# Patient Record
Sex: Male | Born: 2000 | Race: Black or African American | Hispanic: No | Marital: Single | Smoking: Never smoker
Health system: Southern US, Community
[De-identification: ages and names within clinical notes are randomized; demographics above are authoritative.]

## PROBLEM LIST (undated history)

## (undated) DIAGNOSIS — F909 Attention-deficit hyperactivity disorder, unspecified type: Secondary | ICD-10-CM

## (undated) HISTORY — PX: INNER EAR SURGERY: SHX679

---
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
## 2019-04-18 ENCOUNTER — Ambulatory Visit: Payer: Self-pay | Attending: Internal Medicine

## 2020-08-30 ENCOUNTER — Emergency Department: Payer: Medicaid Other

## 2020-08-30 ENCOUNTER — Emergency Department
Admission: EM | Admit: 2020-08-30 | Discharge: 2020-08-30 | Disposition: A | Discharge status: Home / Self Care | Payer: Medicaid Other | Attending: Emergency Medicine | Admitting: Emergency Medicine

## 2020-08-30 DIAGNOSIS — Y990 Civilian activity done for income or pay: Secondary | ICD-10-CM | POA: Insufficient documentation

## 2020-08-30 DIAGNOSIS — X16XXXA Contact with hot heating appliances, radiators and pipes, initial encounter: Secondary | ICD-10-CM | POA: Diagnosis not present

## 2020-08-30 DIAGNOSIS — S0990XA Unspecified injury of head, initial encounter: Secondary | ICD-10-CM | POA: Insufficient documentation

## 2020-08-30 DIAGNOSIS — Y9389 Activity, other specified: Secondary | ICD-10-CM | POA: Insufficient documentation

## 2020-08-30 DIAGNOSIS — Y9289 Other specified places as the place of occurrence of the external cause: Secondary | ICD-10-CM | POA: Diagnosis not present

## 2020-08-30 HISTORY — DX: Attention-deficit hyperactivity disorder, unspecified type: F90.9

## 2020-08-30 NOTE — ED Provider Notes (Signed)
[facility] Emergency Department Provider Note  Time seen: 5:31 AM  I have reviewed the triage vital signs and the nursing notes.   HISTORY  Chief Complaint Head Injury   HPI Zohaib D Holford is a 20 y.o. male with a past medical history of ADHD presents to the emergency department for head injury.  According to the patient at noon yesterday states he was working on a car when an exhaust pipe fell onto his head.  Patient states he felt like he was going to pass out but he did not.  Denies any loss of conscious.  Denies any vomiting.  Denies any weakness or numbness of any arm or leg.   Past Medical History:  Diagnosis Date  . ADHD     There are no problems to display for this patient.   Past Surgical History:  Procedure Laterality Date  . INNER EAR SURGERY      Prior to Admission medications   Not on File    No Known Allergies  No family history on file.  Social History Social History   Tobacco Use  . Smoking status: Never Smoker  Substance Use Topics  . Alcohol use: Never  . Drug use: Never    Review of Systems Constitutional: Negative for fever. Cardiovascular: Negative for chest pain. Respiratory: Negative for shortness of breath. Gastrointestinal: Negative for abdominal pain Musculoskeletal: Negative for musculoskeletal complaints Neurological: Moderate headache All other ROS negative  ____________________________________________   PHYSICAL EXAM:  VITAL SIGNS: ED Triage Vitals  Enc Vitals Group     BP 08/30/20 0158 (!) 144/95     Pulse Rate 08/30/20 0158 87     Resp 08/30/20 0158 18     Temp 08/30/20 0158 99 F (37.2 C)     Temp Source 08/30/20 0158 Oral     SpO2 08/30/20 0158 98 %     Weight --      Height 08/30/20 0158 5\' 9"  (1.753 m)     Head Circumference --      Peak Flow --      Pain Score 08/30/20 0157 6     Pain Loc --      Pain Edu? --      Excl. in GC? --     Constitutional: Alert and oriented. Well  appearing and in no distress. Eyes: Normal exam ENT      Head: Normocephalic and atraumatic.      Mouth/Throat: Mucous membranes are moist. Cardiovascular: Normal rate, regular rhythm Respiratory: Normal respiratory effort without tachypnea nor retractions. Breath sounds are clear Gastrointestinal: Soft and nontender. No distention. Musculoskeletal: Nontender with normal range of motion in all extremities.  Neurologic:  Normal speech and language. No gross focal neurologic deficits are appreciated. Skin:  Skin is warm, dry and intact.  Psychiatric: Mood and affect are normal. Speech and behavior are normal.   ____________________________________________   RADIOLOGY  CT scan head and C-spine are negative for acute abnormality  ____________________________________________   INITIAL IMPRESSION / ASSESSMENT AND PLAN / ED COURSE  Pertinent labs & imaging results that were available during my care of the patient were reviewed by me and considered in my medical decision making (see chart for details).   Patient presents to the emergency department after head injury around noon.  Patient appears well, no distress.  Reassuring physical exam.  CT scans are negative for acute abnormality.  Discussed with patient headache care at home and possible concussion care including avoiding physical or  mental stimulation, Tylenol or ibuprofen and discussed return precautions.  Patient agreeable plan of care.  Lenoard D Marez was evaluated in Emergency Department on 08/30/2020 for the symptoms described in the history of present illness. He was evaluated in the context of the global COVID-19 pandemic, which necessitated consideration that the patient might be at risk for infection with the SARS-CoV-2 virus that causes COVID-19. Institutional protocols and algorithms that pertain to the evaluation of patients at risk for COVID-19 are in a state of rapid change based on information released by regulatory bodies  including the CDC and federal and state organizations. These policies and algorithms were followed during the patient's care in the ED.  ____________________________________________   FINAL CLINICAL IMPRESSION(S) / ED DIAGNOSES  Head injury   Minna Antis, MD 08/30/20 [phone]
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
## 2020-09-19 ENCOUNTER — Other Ambulatory Visit: Payer: Self-pay

## 2020-09-19 ENCOUNTER — Emergency Department: Payer: Medicaid Other

## 2020-09-19 DIAGNOSIS — S40012A Contusion of left shoulder, initial encounter: Secondary | ICD-10-CM | POA: Diagnosis not present

## 2020-09-19 DIAGNOSIS — M791 Myalgia, unspecified site: Secondary | ICD-10-CM | POA: Insufficient documentation

## 2020-09-19 DIAGNOSIS — S4992XA Unspecified injury of left shoulder and upper arm, initial encounter: Secondary | ICD-10-CM | POA: Diagnosis present

## 2020-09-19 DIAGNOSIS — Y92410 Unspecified street and highway as the place of occurrence of the external cause: Secondary | ICD-10-CM | POA: Insufficient documentation
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
## 2020-09-20 ENCOUNTER — Emergency Department
Admission: EM | Admit: 2020-09-20 | Discharge: 2020-09-20 | Disposition: A | Discharge status: Home / Self Care | Payer: Medicaid Other | Attending: Emergency Medicine | Admitting: Emergency Medicine

## 2020-09-20 DIAGNOSIS — S40012A Contusion of left shoulder, initial encounter: Secondary | ICD-10-CM

## 2020-09-20 DIAGNOSIS — M7918 Myalgia, other site: Secondary | ICD-10-CM

## 2020-09-20 MED ORDER — NAPROXEN 500 MG PO TABS
500.0000 mg | ORAL_TABLET | Freq: Two times a day (BID) | ORAL | 0 refills | Status: DC
Start: 2020-09-20 — End: 2021-07-24

## 2020-09-20 MED ORDER — HYDROCODONE-ACETAMINOPHEN 5-325 MG PO TABS: 1.0000 | ORAL_TABLET | Freq: Four times a day (QID) | ORAL | 0 refills | Status: DC | PRN

## 2020-09-20 MED ORDER — ACETAMINOPHEN 500 MG PO TABS
1000.0000 mg | ORAL_TABLET | Freq: Once | ORAL | Status: AC
  Administered 2020-09-20: 1000 mg via ORAL
  Filled 2020-09-20: qty 2

## 2020-09-20 NOTE — Discharge Instructions (Addendum)
Follow-up with your primary care provider or urgent care if any continued problems or concerns.  A prescription for naproxen and hydrocodone was sent to your pharmacy.  The naproxen is for inflammation which will also help with pain.  The hydrocodone is for pain and should not be taken while driving or operating machinery.  Ice and elevation as needed to joints you may also use warm moist compresses to your muscles as needed for discomfort.  You will be sore for approximately 4 to 5 days even with medication.  Try to move frequently to decrease the amount of stiffness.  Tomorrow you will have more stiffness than you currently have now.
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
## 2021-03-27 ENCOUNTER — Other Ambulatory Visit: Payer: Self-pay

## 2021-03-27 ENCOUNTER — Encounter: Payer: Self-pay | Admitting: Emergency Medicine

## 2021-03-27 ENCOUNTER — Emergency Department
Admission: EM | Admit: 2021-03-27 | Discharge: 2021-03-27 | Disposition: A | Discharge status: Home / Self Care | Payer: Medicaid Other | Attending: Emergency Medicine | Admitting: Emergency Medicine

## 2021-03-27 DIAGNOSIS — Z5321 Procedure and treatment not carried out due to patient leaving prior to being seen by health care provider: Secondary | ICD-10-CM | POA: Insufficient documentation

## 2021-03-27 DIAGNOSIS — S61552A Open bite of left wrist, initial encounter: Secondary | ICD-10-CM | POA: Insufficient documentation

## 2021-03-27 DIAGNOSIS — W540XXA Bitten by dog, initial encounter: Secondary | ICD-10-CM | POA: Insufficient documentation

## 2021-03-27 NOTE — ED Notes (Signed)
Pt has not returned

## 2021-03-27 NOTE — ED Triage Notes (Signed)
Patient ambulatory to triage with steady gait, without difficulty or distress noted; pt reports bit by his dog at home PTA; st "he doesn't like to go in his cage, he is just a little dog"; superficial scratch noted to left wrist

## 2021-03-27 NOTE — ED Notes (Signed)
Pt left lobby

## 2021-05-06 ENCOUNTER — Emergency Department: Payer: Medicaid Other

## 2021-05-06 ENCOUNTER — Encounter: Payer: Self-pay | Admitting: Emergency Medicine

## 2021-05-06 ENCOUNTER — Emergency Department
Admission: EM | Admit: 2021-05-06 | Discharge: 2021-05-06 | Disposition: A | Discharge status: Home / Self Care | Payer: Medicaid Other | Attending: Emergency Medicine | Admitting: Emergency Medicine

## 2021-05-06 ENCOUNTER — Other Ambulatory Visit: Payer: Self-pay

## 2021-05-06 DIAGNOSIS — S91332A Puncture wound without foreign body, left foot, initial encounter: Secondary | ICD-10-CM | POA: Insufficient documentation

## 2021-05-06 DIAGNOSIS — W25XXXA Contact with sharp glass, initial encounter: Secondary | ICD-10-CM | POA: Diagnosis not present

## 2021-05-06 DIAGNOSIS — Y92009 Unspecified place in unspecified non-institutional (private) residence as the place of occurrence of the external cause: Secondary | ICD-10-CM | POA: Insufficient documentation

## 2021-05-06 DIAGNOSIS — S99922A Unspecified injury of left foot, initial encounter: Secondary | ICD-10-CM | POA: Diagnosis present

## 2021-05-06 MED ORDER — CEPHALEXIN 500 MG PO CAPS: 500.0000 mg | ORAL_CAPSULE | Freq: Four times a day (QID) | ORAL | 0 refills | Status: DC

## 2021-05-06 MED ORDER — CEPHALEXIN 500 MG PO CAPS
500.0000 mg | ORAL_CAPSULE | Freq: Once | ORAL | Status: AC
  Administered 2021-05-06: 05:00:00 500 mg via ORAL
  Filled 2021-05-06: qty 1

## 2021-05-06 MED ORDER — IBUPROFEN 600 MG PO TABS
600.0000 mg | ORAL_TABLET | ORAL | Status: AC
  Administered 2021-05-06: 05:00:00 600 mg via ORAL
  Filled 2021-05-06: qty 1

## 2021-05-06 NOTE — ED Notes (Signed)
Left foot placed in a basin and soaked with 1/2 betadine and 1/2 saline for approx 5-10 minutes. Then,bottom of left foot scrubbed, cleansed, and dried.

## 2021-05-06 NOTE — ED Triage Notes (Signed)
Pt to ED from home c/o left foot lac.  States broke a glass in kitchen earlier today and stepped on glass this evening.  States pulled out approx 1 inch long piece from foot, was bleeding at the time but has since stopped the bleeding per patient.  Pt ambulatory, skin color WNL, in NAD at this time.

## 2021-05-06 NOTE — ED Provider Notes (Signed)
[facility] Provider Note    Event Date/Time   First MD Initiated Contact with Patient 05/06/21 [phone]     (approximate)   History   Extremity Laceration   HPI  Craig Turner is a 21 y.o. male   reports no significant past medical history takes no prescriptions  Patient earlier today broke a piece of glass on the floor in his home.  This evening sometime around midnight when he was walking about he excellently stepped on a small shard of it with his left foot.  He had a shard of glass that went into the back pointing around the sole of his foot and he had to pull that out.  He also reports he feels like he had a tiny cut right at the bottom of his great toe on the left foot as well.  The injury on the sole of the foot bled a fair amount, and he was able to remove what was 1 large shard of glass.  Since then the area has been sore but bled quite a bit.  The bleeding has since stopped.  The area feels just slightly sore to touch  He is up-to-date on his immunizations including tetanus shot.  Denies any other injury.  No numbness or weakness in the foot.      Physical Exam   Triage Vital Signs: ED Triage Vitals [05/06/21 0215]  Enc Vitals Group     BP 139/86     Pulse Rate (!) 106     Resp 18     Temp 99 F (37.2 C)     Temp Source Oral     SpO2 98 %     Weight (!) 370 lb (167.8 kg)     Height 5\' 8"  (1.727 m)     Head Circumference      Peak Flow      Pain Score 6     Pain Loc      Pain Edu?      Excl. in [place]?     Most recent vital signs: Vitals:   05/06/21 0215  BP: 139/86  Pulse: (!) 106  Resp: 18  Temp: 99 F (37.2 C)  SpO2: 98%     General: Awake, no distress.  CV:  Good peripheral perfusion.  Strong dorsalis pedis posterior tibial pulse and normal capillary refill involving the left foot Resp:  Normal effort.  Abd:  No distention.  Other:  A left lower extremity is atraumatic he uses it with good use and function including  plantar and dorsiflexion toe wiggle of all digits of the left foot.  He does have an area please see clinical image and have uploaded, approximately a little less than 1 cm in size that appears to represent any punctate puncture wound that does not appear to be very deep.  It actually seems to be already starting to close by secondary intention and is not actively bleeding.  There is no noted foreign body within it to careful inspection and examination.  In addition he has a very tiny abrasion noted at the base of the left great toe, but no noted puncture wounds or evidence of foreign body.  He does have a couple small shards that are minuscule in size that were between the first and second toes but no other injury glass or foreign body is noted.     ED Results / Procedures / Treatments   Labs (all labs ordered are listed, but only  abnormal results are displayed) Labs Reviewed - No data to display   EKG     RADIOLOGY  I personally viewed the patient's left foot x-ray which is negative for fracture and I do not see large gross foreign body retained in the soft tissues  I also reviewed the radiologist report  DG Foot Complete Left  Result Date: 05/06/2021 CLINICAL DATA:  Concern for foreign body. EXAM: LEFT FOOT - COMPLETE 3+ VIEW COMPARISON:  None. FINDINGS: There is no acute fracture or dislocation. The bones are well mineralized. No significant arthritic changes. There is pes planus. There is diffuse soft tissue swelling of the forefoot. Punctate radiopaque focus in the soft tissues of the great toe, likely over the skin or a focus of calcification. Additional punctate radiopaque focus noted over the skin in the webspace between the fourth and fifth digits. No other radiopaque foreign object identified. IMPRESSION: 1. No acute osseous pathology. 2. Punctate radiopaque foci in the soft tissues of the great toe and in the webspace between the fourth and fifth digits may be over the skin. 3.  Pes planus. Electronically Signed   By: Anner Crete M.D.   On: 05/06/2021 02:59           PROCEDURES:  Critical Care performed: No  Procedures   MEDICATIONS ORDERED IN ED: Medications  cephALEXin (KEFLEX) capsule 500 mg (500 mg Oral Given 05/06/21 0438)  ibuprofen (ADVIL) tablet 600 mg (600 mg Oral Given 05/06/21 0438)     IMPRESSION / MDM / ASSESSMENT AND PLAN / ED COURSE  I reviewed the triage vital signs and the nursing notes.                              Differential diagnosis includes, but is not limited to, laceration or puncture wound of the left foot.  Retained foreign body also considered, though I do not see clear evidence of this on examination.  Patient no longer having any evidence of any bleeding.  He does have evidence of a small puncture wound at the heel of the left foot, and is not bleeding there is no evidence of foreign body on close inspection and exploration.  His pain is well controlled.  Discussed with the patient, and also patient received iodine soak cleansing and cleaning.  We will start him on cephalexin prophylactically.  We will leave the small puncture wound open, it is no longer bleeding appears to be already healing over by secondary intention.  Discussed careful return precautions, keeping the wound bandaged and clean, and recommendation of follow-up with podiatry.         Patient's foot was cleansed and scrubbed, placed in iodine bath.  I examined the patient's foot closely and I do not see evidence of a retained foreign body at this time.  We will place the patient on prophylactic cephalexin to prevent infection.  He is up-to-date on his tetanus shot.  FINAL CLINICAL IMPRESSION(S) / ED DIAGNOSES   Final diagnoses:  Puncture wound of left foot, initial encounter     Rx / DC Orders   ED Discharge Orders          Ordered    cephALEXin (KEFLEX) 500 MG capsule  4 times daily        05/06/21 0458             Note:  This  document was prepared using Dragon voice recognition software and may include  unintentional dictation errors.   Delman Kitten, MD 05/06/21 [phone]

## 2021-05-06 NOTE — ED Notes (Signed)
Non-adherent pad placed over wound on left foot and secured with coban.

## 2021-07-24 ENCOUNTER — Encounter: Payer: Self-pay | Admitting: Emergency Medicine

## 2021-07-24 ENCOUNTER — Emergency Department: Payer: Medicaid Other

## 2021-07-24 ENCOUNTER — Emergency Department
Admission: EM | Admit: 2021-07-24 | Discharge: 2021-07-24 | Disposition: A | Discharge status: Home / Self Care | Payer: Medicaid Other | Attending: Emergency Medicine | Admitting: Emergency Medicine

## 2021-07-24 ENCOUNTER — Other Ambulatory Visit: Payer: Self-pay

## 2021-07-24 DIAGNOSIS — W19XXXA Unspecified fall, initial encounter: Secondary | ICD-10-CM

## 2021-07-24 DIAGNOSIS — S93401A Sprain of unspecified ligament of right ankle, initial encounter: Secondary | ICD-10-CM | POA: Diagnosis not present

## 2021-07-24 DIAGNOSIS — S99911A Unspecified injury of right ankle, initial encounter: Secondary | ICD-10-CM | POA: Diagnosis present

## 2021-07-24 DIAGNOSIS — S8001XA Contusion of right knee, initial encounter: Secondary | ICD-10-CM | POA: Insufficient documentation

## 2021-07-24 DIAGNOSIS — W010XXA Fall on same level from slipping, tripping and stumbling without subsequent striking against object, initial encounter: Secondary | ICD-10-CM | POA: Insufficient documentation

## 2021-07-24 MED ORDER — CYCLOBENZAPRINE HCL 5 MG PO TABS: 5.0000 mg | ORAL_TABLET | Freq: Three times a day (TID) | ORAL | 0 refills | Status: AC | PRN

## 2021-07-24 MED ORDER — IBUPROFEN 800 MG PO TABS: 800.0000 mg | ORAL_TABLET | Freq: Three times a day (TID) | ORAL | 0 refills | Status: AC | PRN

## 2021-07-24 NOTE — ED Notes (Signed)
See triage note  presents s/p fall  states he slipped 2 days ago   having pain to right knee/ankle  no deformity noted   good pulses ?

## 2021-07-24 NOTE — Discharge Instructions (Addendum)
Your exam and x-rays are normal and reassuring following your fall.  No signs of a serious bony injury.  Take the prescription muscle relaxant and ibuprofen for pain relief.  Apply ice to reduce pain and swelling.  Follow-up with orthopedics if symptoms persist. ?

## 2021-07-24 NOTE — ED Provider Notes (Signed)
? ? ?[facility] ?Emergency Department Provider Note ? ? ? ? Event Date/Time  ? First MD Initiated Contact with Patient 07/24/21 [phone]   ?  (approximate) ? ? ?History  ? ?Fall ? ? ?HPI ? ?Craig Turner is a 21 y.o. male with a history of ADHD, presents to the ED following a mechanical fall 2 days prior.  Patient reports he slipped on a wet floor, causing pain to the right ankle and the right knee.  Denies any head injury or LOC. ?  ? ? ?Physical Exam  ? ?Triage Vital Signs: ?ED Triage Vitals  ?Enc Vitals Group  ?   BP 07/24/21 0748 (!) 139/95  ?   Pulse Rate 07/24/21 0748 87  ?   Resp 07/24/21 0748 18  ?   Temp 07/24/21 0748 98 ?F (36.7 ?C)  ?   Temp Source 07/24/21 0748 Oral  ?   SpO2 07/24/21 0748 94 %  ?   Weight 07/24/21 0742 (!) 369 lb 14.9 oz (167.8 kg)  ?   Height 07/24/21 0742 5\' 8"  (1.727 m)  ?   Head Circumference --   ?   Peak Flow --   ?   Pain Score 07/24/21 0742 7  ?   Pain Loc --   ?   Pain Edu? --   ?   Excl. in [place]? --   ? ? ?Most recent vital signs: ?Vitals:  ? 07/24/21 0748  ?BP: (!) 139/95  ?Pulse: 87  ?Resp: 18  ?Temp: 98 ?F (36.7 ?C)  ?SpO2: 94%  ? ? ?General Awake, no distress.  ?CV:  Good peripheral perfusion.  ?RESP:  Normal effort.  ?ABD:  No distention.  ?MSK:  Right knee without obvious deformity, dislocation, or joint effusion.  Patient with normal active range of motion on exam.  No significant valgus or varus when stress is elicited.  No evidence of internal derangement.  Mildly tender to palpation to the lateral knee joint at the fibular head.  No calf or Achilles tenderness is elicited.  Exam is reassuring and shows no deformity or joint effusion.  Full active range of motion is noted. ? ? ?ED Results / Procedures / Treatments  ? ?Labs ?(all labs ordered are listed, but only abnormal results are displayed) ?Labs Reviewed - No data to display ? ? ?EKG ? ? ? ?RADIOLOGY ? ?I personally viewed and evaluated these images as part of my medical decision making, as  well as reviewing the written report by the radiologist. ? ?ED Provider Interpretation: no acute findings} ? ?DG Ankle Complete Right ? ?Result Date: 07/24/2021 ?CLINICAL DATA:  Fall a few days ago, right ankle and knee pain EXAM: RIGHT ANKLE - COMPLETE 3+ VIEW COMPARISON:  None. FINDINGS: There is no acute fracture or dislocation. Alignment is normal. The ankle mortise is intact. There is soft tissue swelling over the ankle. IMPRESSION: Soft tissue swelling with no acute osseous abnormality. Electronically Signed   By: Valetta Mole M.D.   On: 07/24/2021 08:07  ? ?DG Knee Complete 4 Views Right ? ?Result Date: 07/24/2021 ?CLINICAL DATA:  right ankle pain EXAM: RIGHT KNEE - COMPLETE 4+ VIEW COMPARISON:  None. FINDINGS: No evidence of fracture, dislocation, or joint effusion. No evidence of arthropathy or other focal bone abnormality. Soft tissues are unremarkable. IMPRESSION: Negative. Electronically Signed   By: Lucrezia Europe M.D.   On: 07/24/2021 08:05   ? ? ?PROCEDURES: ? ?Critical Care performed: No ? ?Procedures ? ? ?  MEDICATIONS ORDERED IN ED: ?Medications - No data to display ? ? ?IMPRESSION / MDM / ASSESSMENT AND PLAN / ED COURSE  ?I reviewed the triage vital signs and the nursing notes. ?             ?               ? ?Differential diagnosis includes, but is not limited to, knee sprain, knee fracture, dislocation, contusion, ankle sprain ? ?Patient to the ED for evaluation after mechanical fall.  Patient is no acute distress reporting pain to the right knee and right ankle, respectively.  Patient's exam is reassuring as it shows no acute internal derangement to the right knee or acute dysfunction of the ankle.  Radiologic evaluation reveals no acute bony process based on my review of images.  Patient's diagnosis is consistent with knee sprain and ankle sprain. Patient will be discharged home with prescriptions for naproxen and Flexeril. Patient is to follow up with orthopedics as needed or otherwise directed.  Patient is given ED precautions to return to the ED for any worsening or new symptoms. ? ?FINAL CLINICAL IMPRESSION(S) / ED DIAGNOSES  ? ?Final diagnoses:  ?Fall, initial encounter  ?Contusion of right knee, initial encounter  ?Sprain of right ankle, unspecified ligament, initial encounter  ? ? ? ?Rx / DC Orders  ? ?ED Discharge Orders   ? ?      Ordered  ?  cyclobenzaprine (FLEXERIL) 5 MG tablet  3 times daily PRN       ? 07/24/21 [id number]  ?  ibuprofen (ADVIL) 800 MG tablet  Every 8 hours PRN       ? 07/24/21 [id number]  ? ?  ?  ? ?  ? ? ? ?Note:  This document was prepared using Dragon voice recognition software and may include unintentional dictation errors. ? ?  ?Melvenia Needles, PA-C ?07/24/21 [id number] ? ?  ?Naaman Plummer, MD ?07/31/21 1034 ? ?

## 2021-07-24 NOTE — ED Triage Notes (Signed)
Pt comes into the ED via POV c/o fall a couple days ago that is causing right side ankle and knee pain.  Pt ambulatory and in NAD.  Pt states he slipped on the floor which is what caused the fall.  ?

## 2021-10-14 DIAGNOSIS — Z0279 Encounter for issue of other medical certificate: Secondary | ICD-10-CM

## 2022-05-01 IMAGING — CT CT HEAD W/O CM
3 series · 15 of 47 positions shown, 18 images · non-contrast
Comparison: None.

CLINICAL DATA: Facial trauma

EXAM:
CT HEAD WITHOUT CONTRAST
CT CERVICAL SPINE WITHOUT CONTRAST
TECHNIQUE: Multidetector CT imaging of the head and cervical spine was
performed following the standard protocol without intravenous
contrast. Multiplanar CT image reconstructions of the cervical spine
were also generated.

[Series 2: head wo · axial · 0.43mm/px · z∈[-92,+38]mm · 9 of 32 slices shown, 12 images]
[im 3/32  brain]
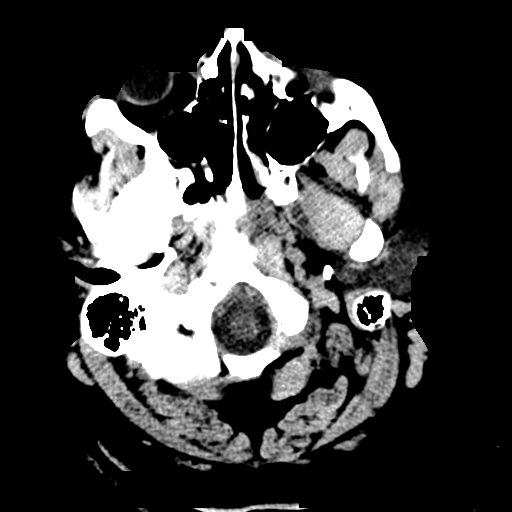
[im 3/32  bone]
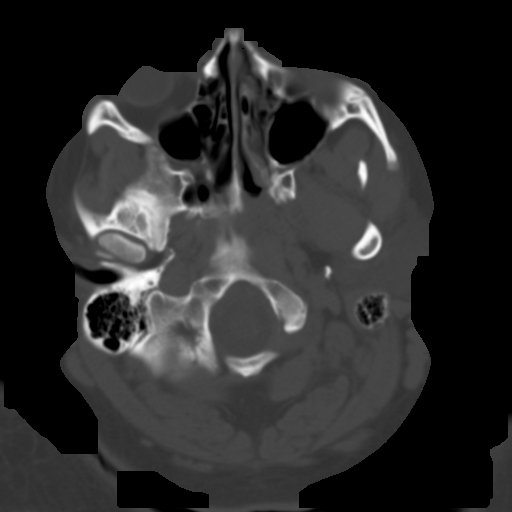
[im 6/32  brain]
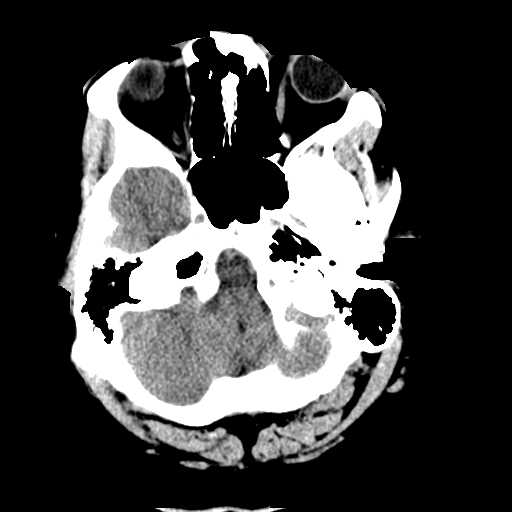
[im 9/32  brain]
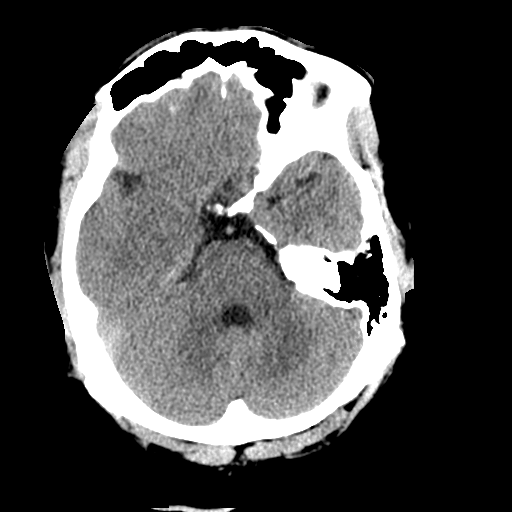
[im 12/32  brain]
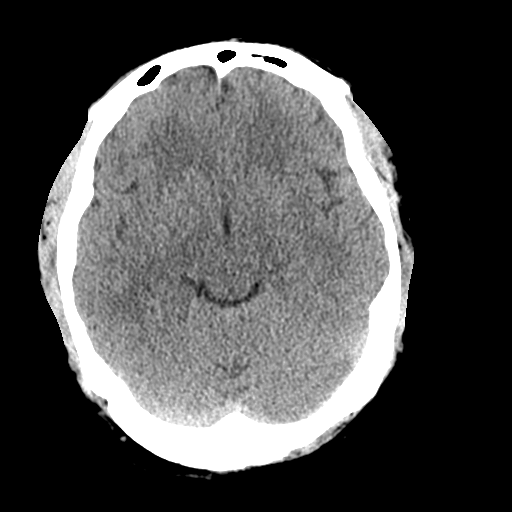
[im 17/32  brain]
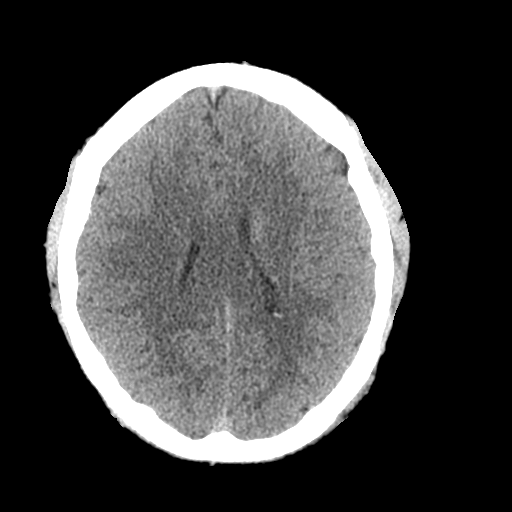
[im 17/32  bone]
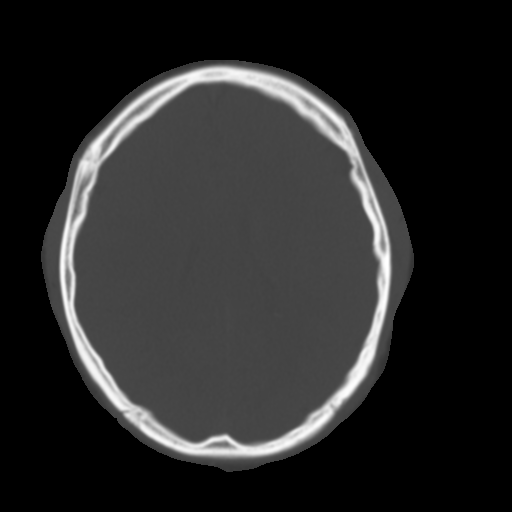
[im 20/32  brain]
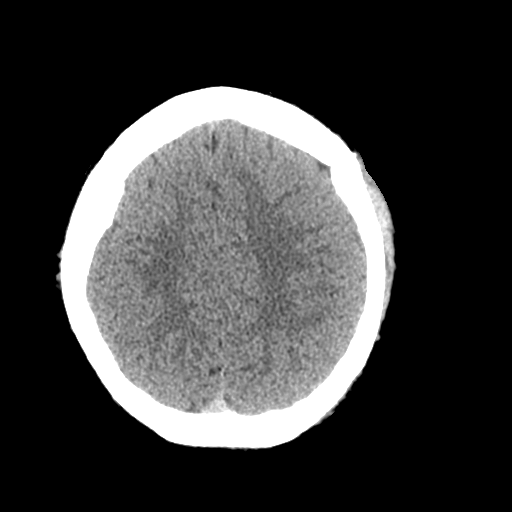
[im 23/32  brain]
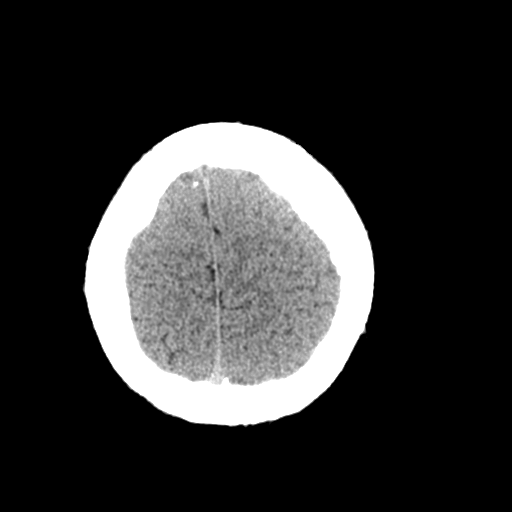
[im 26/32  brain]
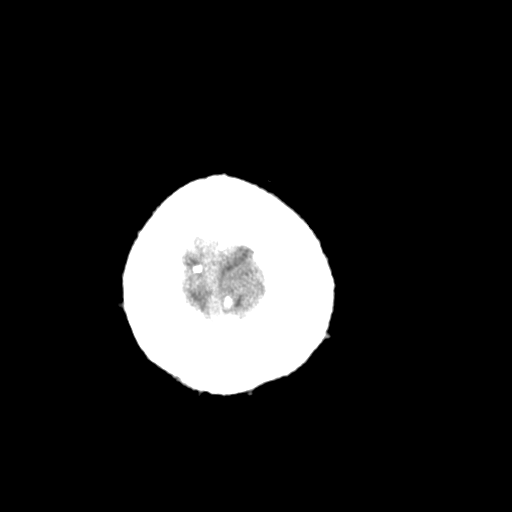
[im 29/32  brain]
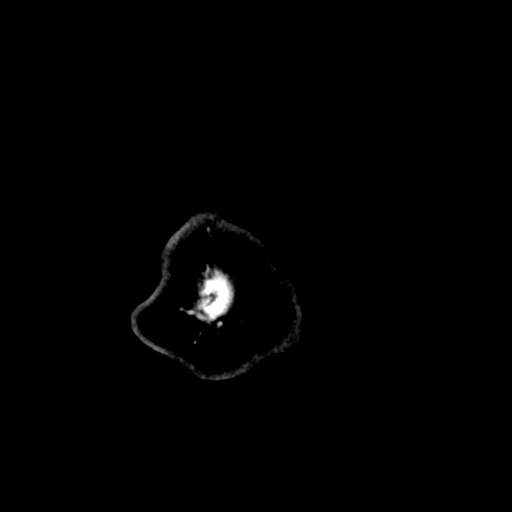
[im 29/32  bone]
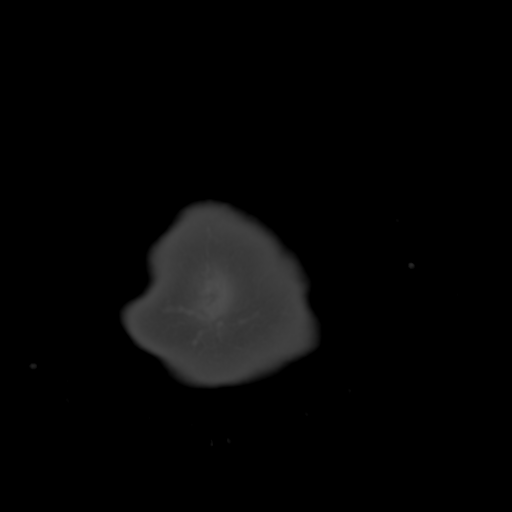

[Series 4: coronal soft tissue · coronal · 0.40mm/px · 3 of 72 slices shown]
[im 24/72  brain]
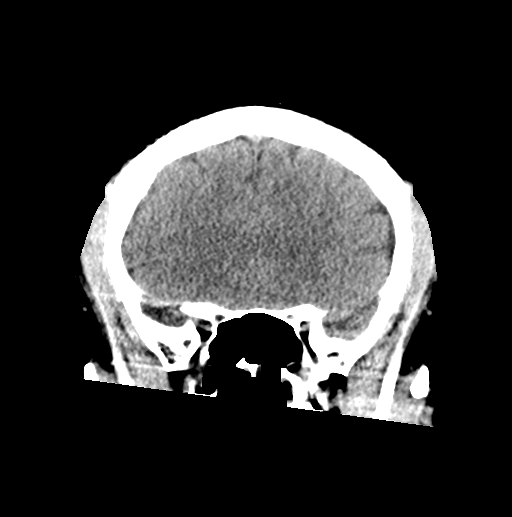
[im 32/72  brain]
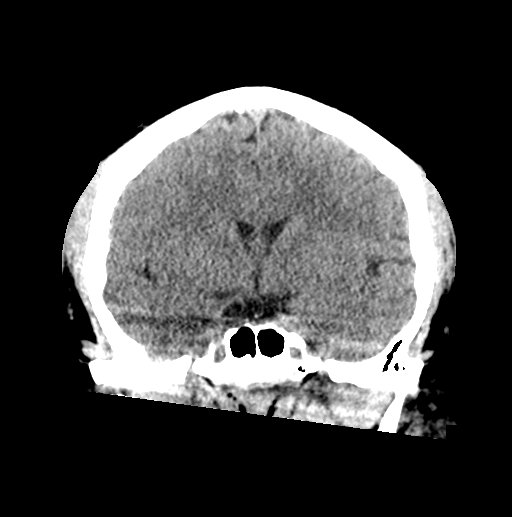
[im 40/72  brain]
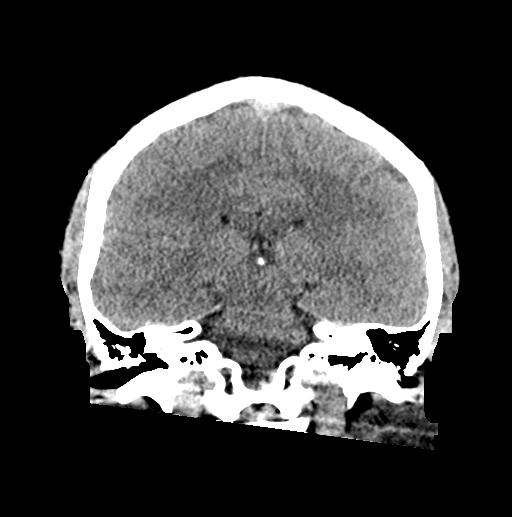

[Series 5: sagittal soft tissue · sagittal · 0.33mm/px · 3 of 64 slices shown]
[im 24/64  brain]
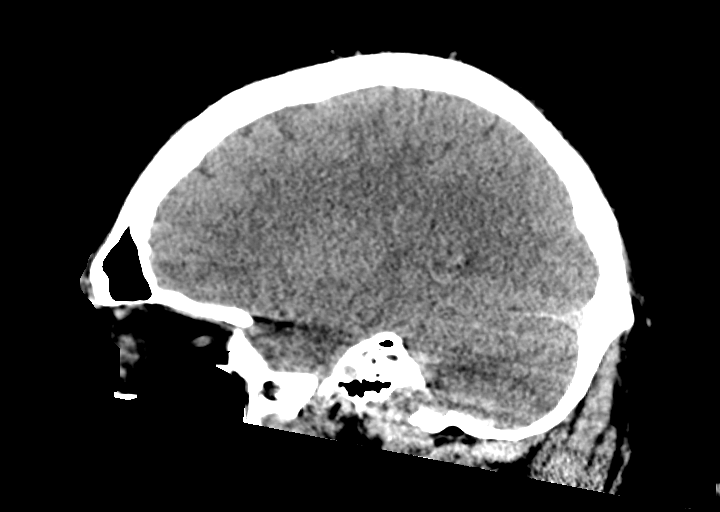
[im 32/64  brain]
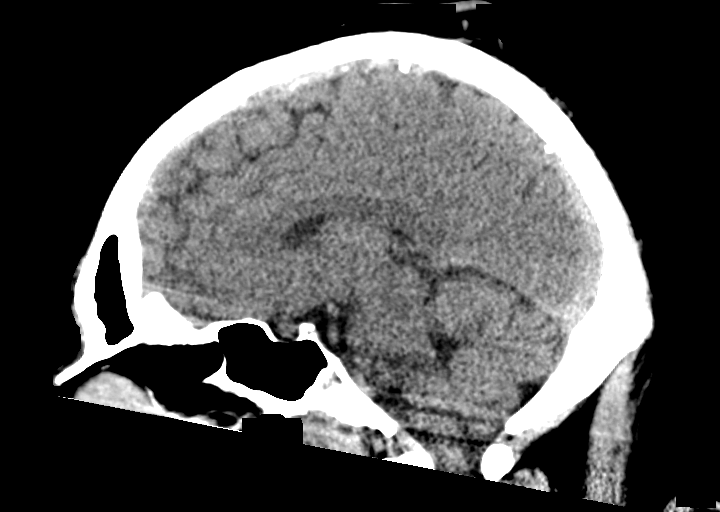
[im 40/64  brain]
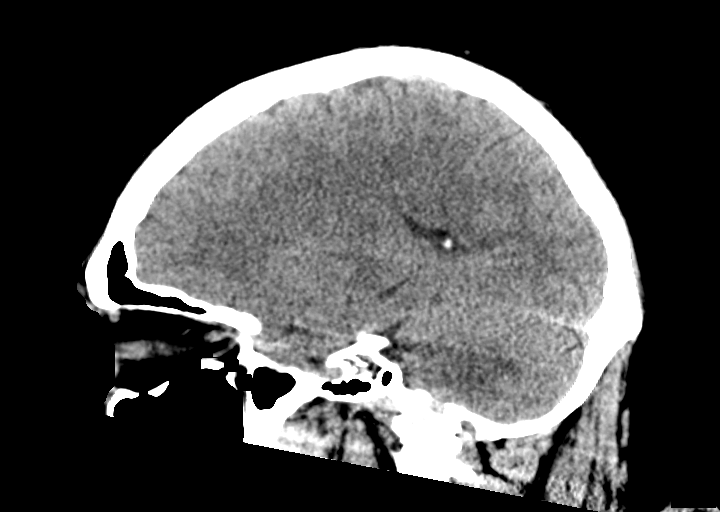

[15 of 47 positions shown; findings below may reference images not displayed]

FINDINGS: CT HEAD FINDINGS

Brain: There is no mass, hemorrhage or extra-axial collection. The
size and configuration of the ventricles and extra-axial CSF spaces
are normal. The brain parenchyma is normal, without evidence of
acute or chronic infarction.

Vascular: No abnormal hyperdensity of the major intracranial
arteries or dural venous sinuses. No intracranial atherosclerosis.

Skull: The visualized skull base, calvarium and extracranial soft
tissues are normal.

Sinuses/Orbits: No fluid levels or advanced mucosal thickening of
the visualized paranasal sinuses. No mastoid or middle ear effusion.
The orbits are normal.

CT CERVICAL SPINE FINDINGS

Alignment: No static subluxation. Facets are aligned. Occipital
condyles are normally positioned.

Skull base and vertebrae: No acute fracture.

Soft tissues and spinal canal: No prevertebral fluid or swelling. No
visible canal hematoma.

Disc levels: No advanced spinal canal or neural foraminal stenosis.

Upper chest: No pneumothorax, pulmonary nodule or pleural effusion.

Other: Normal visualized paraspinal cervical soft tissues.
IMPRESSION: 1. No acute intracranial abnormality.
2. No acute fracture or static subluxation of the cervical spine.

## 2022-05-01 IMAGING — CT CT CERVICAL SPINE W/O CM
3 of 4 series · 10 of 33 positions shown, 12 images · non-contrast
Comparison: None.

CLINICAL DATA: Facial trauma

EXAM:
CT HEAD WITHOUT CONTRAST
CT CERVICAL SPINE WITHOUT CONTRAST
TECHNIQUE: Multidetector CT imaging of the head and cervical spine was
performed following the standard protocol without intravenous
contrast. Multiplanar CT image reconstructions of the cervical spine
were also generated.

[Series 4: sagittal bone · sagittal · 0.38mm/px · 5 of 111 slices shown, 6 images]
[im 37/111  bone]
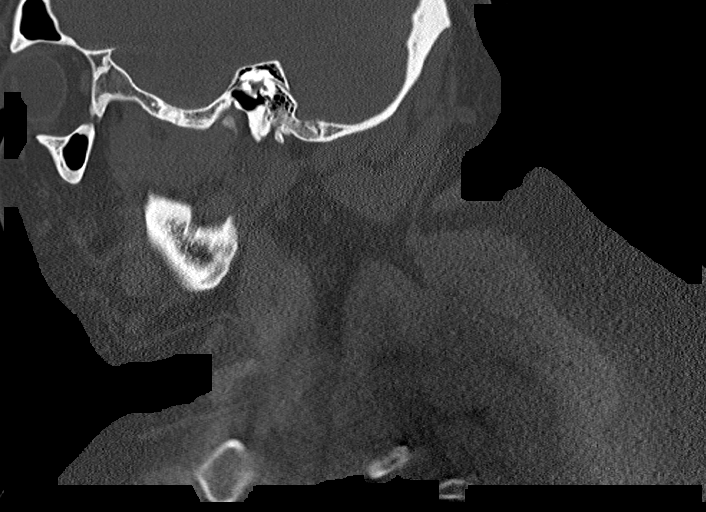
[im 46/111  bone]
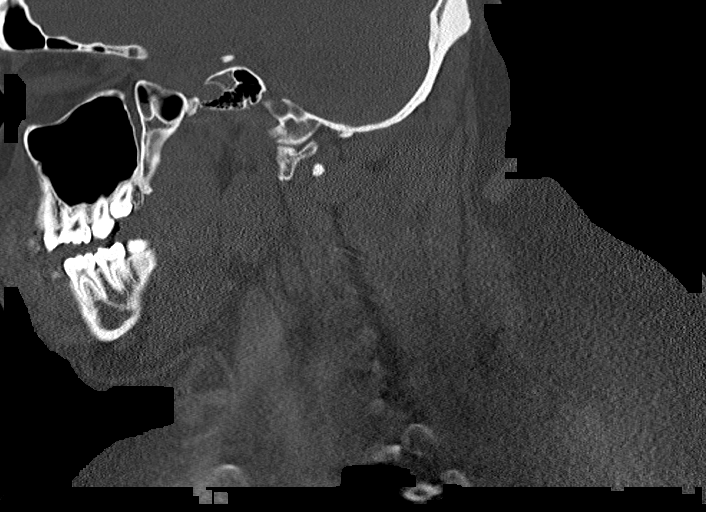
[im 56/111  soft-tissue]
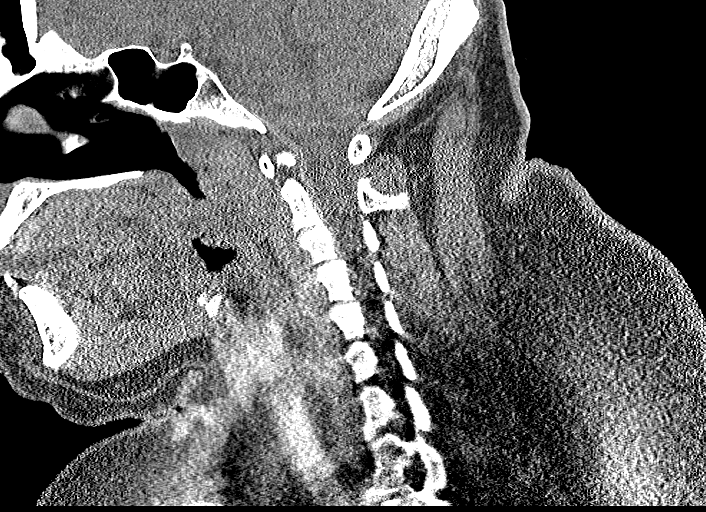
[im 56/111  bone]
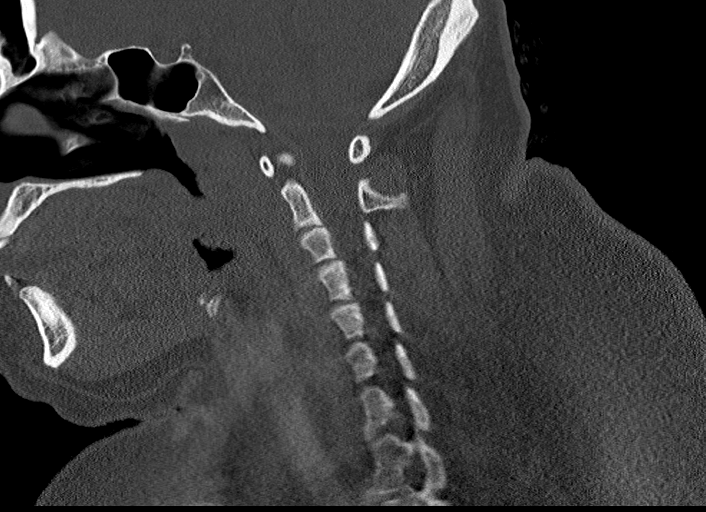
[im 65/111  bone]
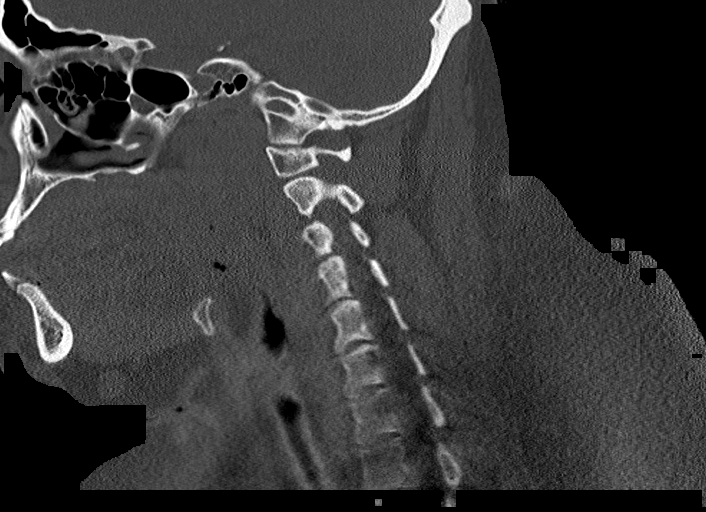
[im 74/111  bone]
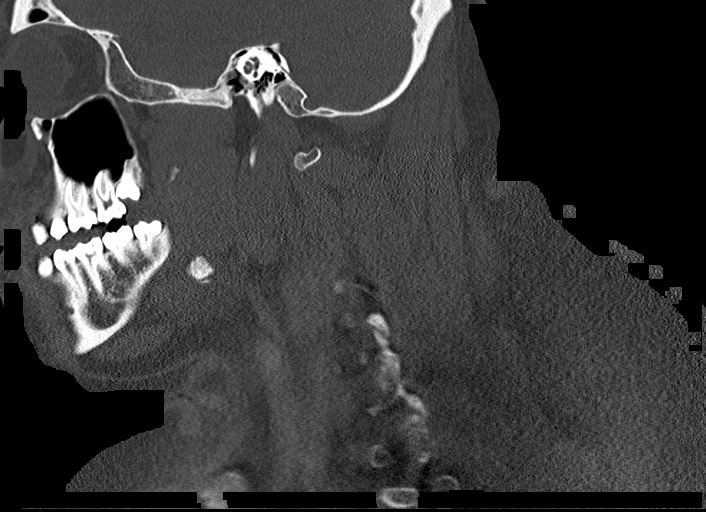

[Series 5: coronal bone · coronal · 0.39mm/px · 3 of 103 slices shown]
[im 25/103  bone]
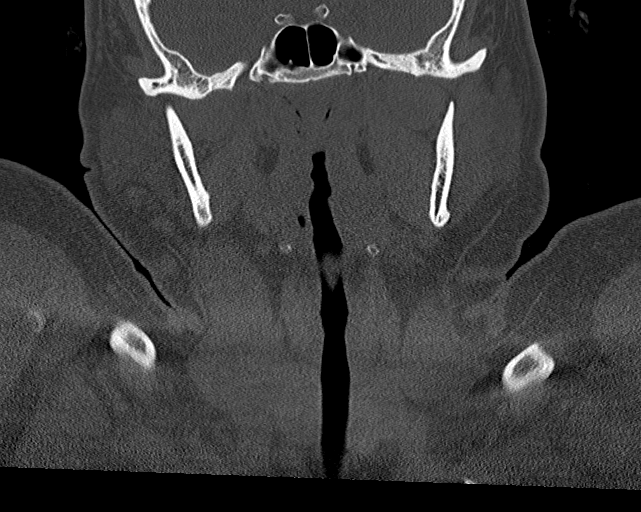
[im 43/103  bone]
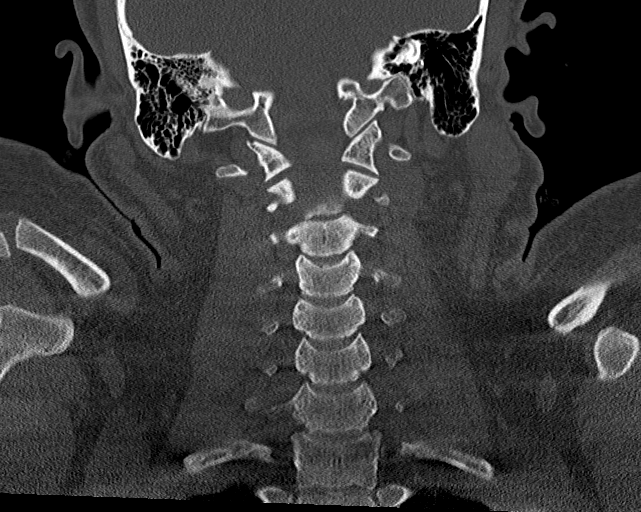
[im 61/103  bone]
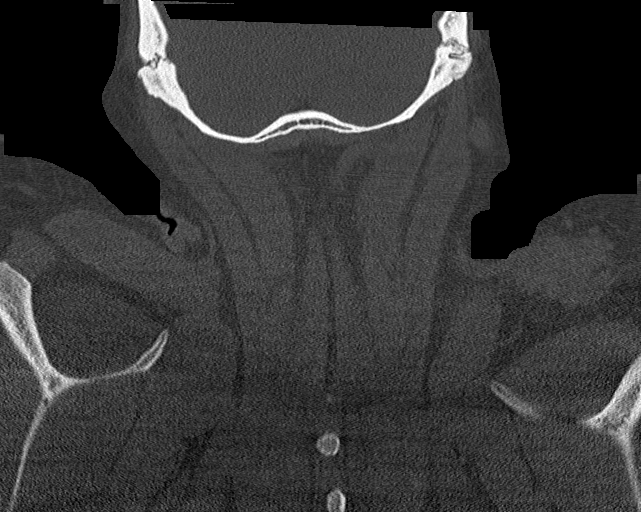

[Series 6: orthogonal bone · axial · 0.43mm/px · z∈[-263,-171]mm · 2 of 117 slices shown, 3 images]
[im 34/117  soft-tissue]
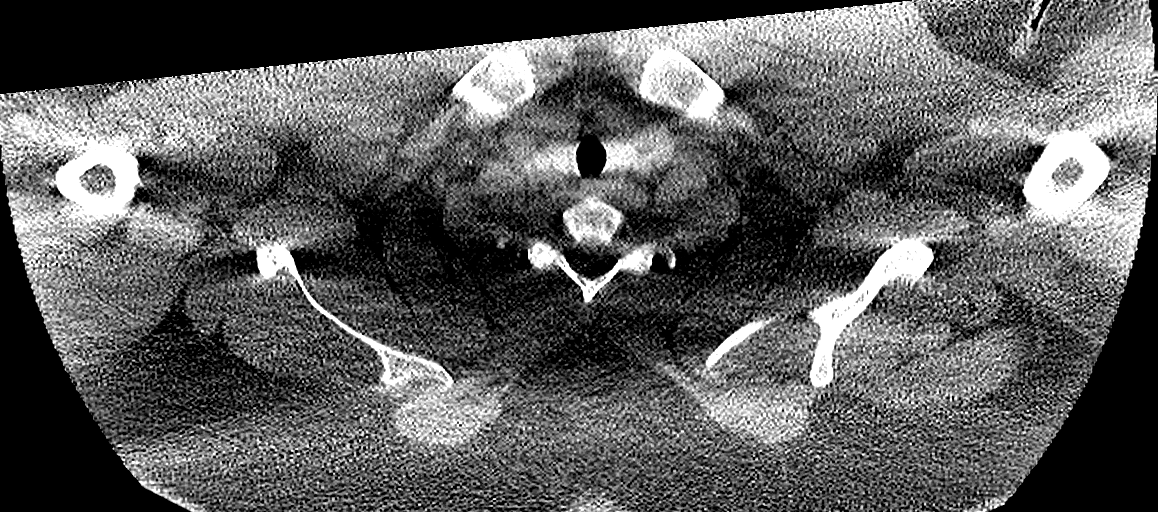
[im 34/117  bone]
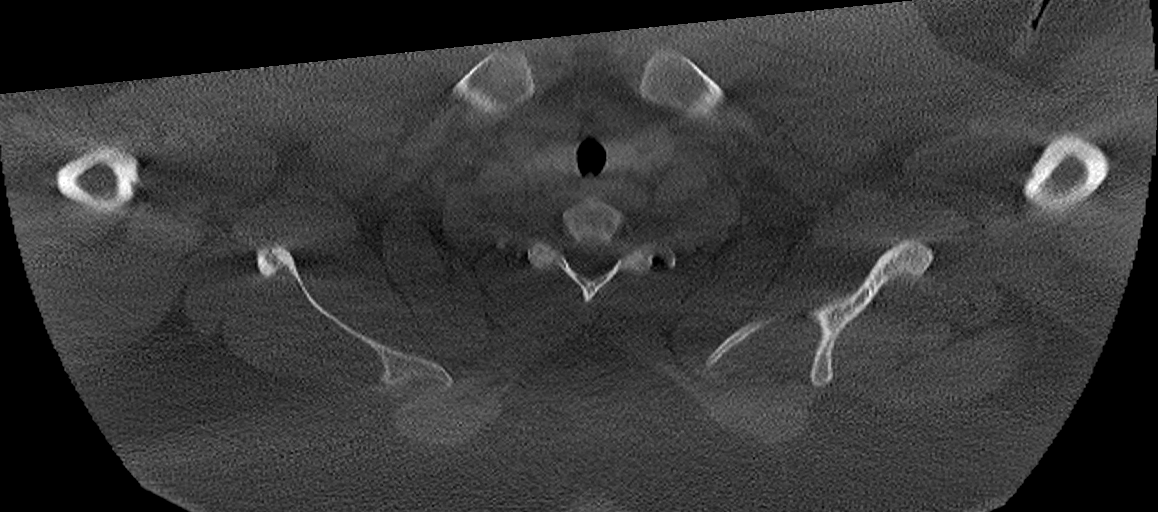
[im 83/117  bone]
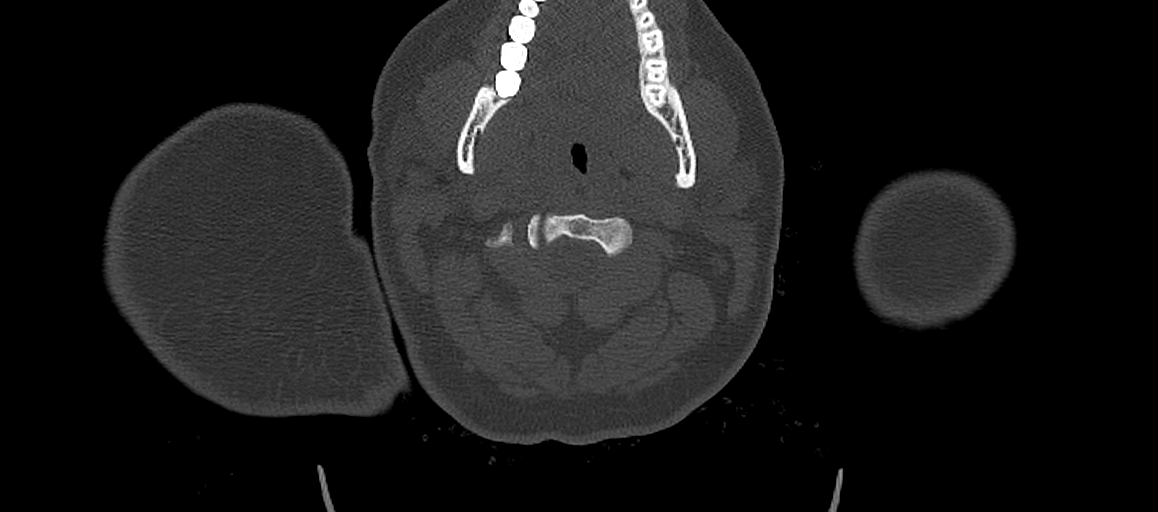

[10 of 33 positions shown; findings below may reference images not displayed]

FINDINGS: CT HEAD FINDINGS

Brain: There is no mass, hemorrhage or extra-axial collection. The
size and configuration of the ventricles and extra-axial CSF spaces
are normal. The brain parenchyma is normal, without evidence of
acute or chronic infarction.

Vascular: No abnormal hyperdensity of the major intracranial
arteries or dural venous sinuses. No intracranial atherosclerosis.

Skull: The visualized skull base, calvarium and extracranial soft
tissues are normal.

Sinuses/Orbits: No fluid levels or advanced mucosal thickening of
the visualized paranasal sinuses. No mastoid or middle ear effusion.
The orbits are normal.

CT CERVICAL SPINE FINDINGS

Alignment: No static subluxation. Facets are aligned. Occipital
condyles are normally positioned.

Skull base and vertebrae: No acute fracture.

Soft tissues and spinal canal: No prevertebral fluid or swelling. No
visible canal hematoma.

Disc levels: No advanced spinal canal or neural foraminal stenosis.

Upper chest: No pneumothorax, pulmonary nodule or pleural effusion.

Other: Normal visualized paraspinal cervical soft tissues.
IMPRESSION: 1. No acute intracranial abnormality.
2. No acute fracture or static subluxation of the cervical spine.

## 2023-05-16 ENCOUNTER — Ambulatory Visit: Payer: Medicaid Other | Admitting: Dietician

## 2023-06-06 ENCOUNTER — Encounter: Payer: No Typology Code available for payment source | Admitting: Dietician

## 2023-06-06 DIAGNOSIS — Z713 Dietary counseling and surveillance: Secondary | ICD-10-CM | POA: Insufficient documentation

## 2023-06-06 NOTE — Progress Notes (Signed)
 Medical Nutrition Therapy  Appointment Start time:  64  Appointment End time:  1205  Primary concerns today: weight management  Referral diagnosis: E66.01 (ICD-10-CM) - Morbid obesity (HCC)  Preferred learning style:  no preference indicated Learning readiness: change in progress   NUTRITION ASSESSMENT    Clinical Medical Hx: Past Medical History:  Diagnosis Date   ADHD     Medications:  Current Outpatient Medications:    Semaglutide-Weight Management (WEGOVY) 1 MG/0.5ML SOAJ, Inject 1 mg into the skin once a week., Disp: , Rfl:    cyclobenzaprine (FLEXERIL) 5 MG tablet, Take 1 tablet (5 mg total) by mouth 3 (three) times daily as needed. (Patient not taking: Reported on 06/06/2023), Disp: 15 tablet, Rfl: 0   ibuprofen (ADVIL) 800 MG tablet, Take 1 tablet (800 mg total) by mouth every 8 (eight) hours as needed. (Patient not taking: Reported on 06/06/2023), Disp: 30 tablet, Rfl: 0   Labs: No results found for: "HGBA1C" No results found for: "CHOL", "HDL", "LDLCALC", "LDLDIRECT", "TRIG", "CHOLHDL" BP Readings from Last 3 Encounters:  07/24/21 (!) 139/95  05/06/21 132/85  03/27/21 125/68    Last vitamin D No results found for: "25OHVITD2", "25OHVITD3", "VD25OH"  Notable Signs/Symptoms: Pt reports he is taking weekly prescribed vitamin D    Lifestyle & Dietary Hx Pt present today with his girlfriend, "Ty". Pt reports he desires weight reductions to a lower BMI range. Pt reports he lives with his girlfriend and 26 year old son. Pt reports his girlfriend does the shopping and cooking. Pt reports he is working part time mostly on his feet. Pt desires to join the [employer] and states a 46 inch waist required. Pt reports he has increased physical activity and avoids regular soda.  Pt reports he avoids pork and beef and has successfully decreased snacking. Pt reports his appetite has decreased "alot" since increasing weekly GLP medication. 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  All Pt's questions were questions during this encounter.    Estimated daily fluid intake: 16.9 oz * 4+ daily Supplements: none Sleep: good average hours nightly 6-8 hours Stress / self-care: 5-6 out of 10 / self care includes time alone, breathing, work out, read the bible Current average weekly physical activity: 4-5 days weekly, cardio 30+, weights 30 minutes+, twice weekly 30-60 minutes   24-Hr Dietary Recall Wake up 5-6am First Meal: skips ~7/d/w  or 1 boiled egg Snack: none Second Meal: skips ~7/d/w or 1 breaded chicken strip Snack: none Third Meal: 6-9 pm: [place] patty,  Snack: none Beverages: water, powerade,    NUTRITION DIAGNOSIS  NB-1.1 Food and nutrition-related knowledge deficit As related to no prior nutrition related education.  As evidenced by Pt's reports and dietary recall.   NUTRITION INTERVENTION  Nutrition education (E-1) on the following topics:  Fruits & Vegetables: Aim to fill half your plate with a variety of fruits and vegetables. They are rich in vitamins, minerals, and fiber, and can help reduce the risk of chronic diseases. Choose a colorful assortment of fruits and vegetables to ensure you get a wide range of nutrients. Grains and Starches: Make at least half of your grain choices whole grains, such as brown rice, whole wheat bread, and oats. Whole grains provide fiber, which aids in digestion and healthy cholesterol levels. Aim for whole forms of starchy vegetables such as potatoes, sweet potatoes, beans, peas, and corn, which are fiber rich and provide many vitamins  and minerals.  Protein: Incorporate lean sources of protein, such as poultry, fish, beans, nuts, and seeds, into your meals. Protein is essential for building and repairing tissues, staying full, balancing blood sugar, as well as supporting immune function. Dairy: Include low-fat or fat-free dairy products like milk, yogurt, and  cheese in your diet. Dairy foods are excellent sources of calcium and vitamin D, which are crucial for bone health.  Physical Activity: Aim for 60 minutes of physical activity daily. Regular physical activity promotes overall health-including helping to reduce risk for heart disease and diabetes, promoting mental health, and helping [place] sleep better.    Handouts Provided Wellsite geologist Snack List  Learning Style & Readiness for Change Teaching method utilized: Visual & Auditory  Demonstrated degree of understanding via: Teach Back  Barriers to learning/adherence to lifestyle change: none  Goals Established by Pt Aim for 3 balanced meals daily Select a balanced snack in replace of a skipped meal   MONITORING & EVALUATION Dietary intake, weekly physical activity  Next Steps  Patient is to PRN.

## 2023-06-13 ENCOUNTER — Other Ambulatory Visit: Payer: Self-pay

## 2023-06-13 ENCOUNTER — Emergency Department
Admission: EM | Admit: 2023-06-13 | Discharge: 2023-06-13 | Disposition: A | Discharge status: Home / Self Care | Attending: Emergency Medicine | Admitting: Emergency Medicine

## 2023-06-13 DIAGNOSIS — R109 Unspecified abdominal pain: Secondary | ICD-10-CM | POA: Insufficient documentation

## 2023-06-13 DIAGNOSIS — R112 Nausea with vomiting, unspecified: Secondary | ICD-10-CM | POA: Insufficient documentation

## 2023-06-13 DIAGNOSIS — R197 Diarrhea, unspecified: Secondary | ICD-10-CM | POA: Diagnosis not present

## 2023-06-13 LAB — COMPREHENSIVE METABOLIC PANEL
ALT: 25 U/L (ref 0–44)
AST: 18 U/L (ref 15–41)
Albumin: 4.1 g/dL (ref 3.5–5.0)
Alkaline Phosphatase: 59 U/L (ref 38–126)
Anion gap: 7 (ref 5–15)
BUN: 11 mg/dL (ref 6–20)
CO2: 26 mmol/L (ref 22–32)
Calcium: 9.3 mg/dL (ref 8.9–10.3)
Chloride: 105 mmol/L (ref 98–111)
Creatinine, Ser: 1.13 mg/dL (ref 0.61–1.24)
GFR, Estimated: >60 mL/min (ref >60)
Glucose, Bld: 99 mg/dL (ref 70–99)
Potassium: 3.8 mmol/L (ref 3.5–5.1)
Sodium: 138 mmol/L (ref 135–145)
Total Bilirubin: 0.7 mg/dL (ref 0.0–1.2)
Total Protein: 7.6 g/dL (ref 6.5–8.1)

## 2023-06-13 LAB — CBC
HCT: 44.2 % (ref 39.0–52.0)
Hemoglobin: 14.5 g/dL (ref 13.0–17.0)
MCH: 29.2 pg (ref 26.0–34.0)
MCHC: 32.8 g/dL (ref 30.0–36.0)
MCV: 88.9 fL (ref 80.0–100.0)
Platelets: 270 10*3/uL (ref 150–400)
RBC: 4.97 MIL/uL (ref 4.22–5.81)
RDW: 13.2 % (ref 11.5–15.5)
WBC: 8.2 10*3/uL (ref 4.0–10.5)
nRBC: 0 % (ref 0.0–0.2)

## 2023-06-13 LAB — URINALYSIS, ROUTINE W REFLEX MICROSCOPIC
Bacteria, UA: NONE SEEN
Bilirubin Urine: NEGATIVE
Glucose, UA: NEGATIVE mg/dL
Ketones, ur: NEGATIVE mg/dL
Leukocytes,Ua: NEGATIVE
Nitrite: NEGATIVE
Protein, ur: 30 mg/dL — AB
Specific Gravity, Urine: 1.032 — ABNORMAL HIGH (ref 1.005–1.030)
pH: 5 (ref 5.0–8.0)

## 2023-06-13 LAB — LIPASE, BLOOD: Lipase: 35 U/L (ref 11–51)

## 2023-06-13 MED ORDER — PROMETHAZINE HCL 12.5 MG PO TABS: 12.5000 mg | ORAL_TABLET | Freq: Four times a day (QID) | ORAL | 0 refills | Status: DC | PRN

## 2023-06-13 MED ORDER — AZITHROMYCIN 250 MG PO TABS: ORAL_TABLET | ORAL | 0 refills | Status: AC

## 2023-06-13 NOTE — Discharge Instructions (Signed)
 Your lab work was normal today so we held off on getting a CT scan.  If your symptoms worsen or you develop a fever with the same symptoms please return to the emergency department.  Given the duration of your symptoms I believe this may be caused by a bacterial infection.  Please take the antibiotics as prescribed.  I have also sent a different nausea medication called Phenergan.  Do not take this at the same time as the Zofran.  You can take 1 or the other.  Follow-up with your primary care provider later this week.  I have also attached information for GI follow-up.

## 2023-06-13 NOTE — ED Triage Notes (Addendum)
 Pt comes with N/V/D and belly pain for over week now. Pt states he did go to doc and gets some meds but pain is worse. Pt states lower abdomen area.  Pt states he has been on [employer] for about 2 months now.

## 2023-06-13 NOTE — ED Provider Notes (Signed)
 [facility] Provider Note    Event Date/Time   First MD Initiated Contact with Patient 06/13/23 [phone]     (approximate)   History   Abdominal Pain   HPI  Kimsey D Swavely is a 23 y.o. male with PMH of ADHD who presents for evaluation of abdominal pain.  Patient states that he has had vomiting and diarrhea for a week.  He describes having 8-10 episodes of vomiting and diarrhea per day.  He denies any associated fever.  Reports pain to the upper abdomen.  Denies bloody diarrhea/vomit and urinary symptoms.  Describes having a burning pain in his stomach, which then leads to the vomiting and diarrhea.      Physical Exam   Triage Vital Signs: ED Triage Vitals  Encounter Vitals Group     BP 06/13/23 0856 131/79     Systolic BP Percentile --      Diastolic BP Percentile --      Pulse Rate 06/13/23 0856 77     Resp 06/13/23 0856 19     Temp 06/13/23 0856 98 F (36.7 C)     Temp src --      SpO2 06/13/23 0856 100 %     Weight 06/13/23 0854 (!) 355 lb (161 kg)     Height 06/13/23 0854 5\' 9"  (1.753 m)     Head Circumference --      Peak Flow --      Pain Score 06/13/23 0854 7     Pain Loc --      Pain Education --      Exclude from Growth Chart --     Most recent vital signs: Vitals:   06/13/23 0856  BP: 131/79  Pulse: 77  Resp: 19  Temp: 98 F (36.7 C)  SpO2: 100%   General: Awake, no distress.  CV:  Good peripheral perfusion.  RRR. Resp:  Normal effort.  CTAB. Abd:  No distention.  Soft, bowel sounds appropriate, tenderness to palpation across the upper abdomen. Other:  Oral mucous membranes are moist.   ED Results / Procedures / Treatments   Labs (all labs ordered are listed, but only abnormal results are displayed) Labs Reviewed  URINALYSIS, ROUTINE W REFLEX MICROSCOPIC - Abnormal; Notable for the following components:      Result Value   Color, Urine YELLOW (*)    APPearance CLEAR (*)    Specific Gravity, Urine 1.032 (*)    Hgb  urine dipstick MODERATE (*)    Protein, ur 30 (*)    All other components within normal limits  LIPASE, BLOOD  COMPREHENSIVE METABOLIC PANEL  CBC    PROCEDURES:  Critical Care performed: No  Procedures   MEDICATIONS ORDERED IN ED: Medications - No data to display   IMPRESSION / MDM / ASSESSMENT AND PLAN / ED COURSE  I reviewed the triage vital signs and the nursing notes.                             23 year old male presents for evaluation of nausea, vomiting, diarrhea and abdominal pain for over a week.  Vital signs are stable and patient NAD and nontoxic-appearing on exam.  Differential diagnosis includes, but is not limited to, viral gastroenteritis, gastroparesis, pancreatitis, gallbladder disease, diverticulitis.  Patient's presentation is most consistent with acute complicated illness / injury requiring diagnostic workup.  CMP, CBC and lipase all normal.  UA shows moderate hemoglobin with  some protein but is also otherwise normal.  Patient does have some tenderness to the upper abdomen on exam so I did consider getting a CT scan, however given that he is afebrile and the lab work is completely normal I did not feel that this would be high yield.  Since he has had symptoms for over a week I am most suspicious for an infectious diarrhea.  He also takes [place] so this is a possible side effect of the medication or overeating while on the medication.  Patient will be sent different nausea medication as he states the Zofran has not been effective.  Will also start him on a Z-Pak.  He was given a note for work.  I advised him to follow-up with his primary care provider and also give him GI follow-up information.  Reviewed return precautions.  Patient voiced understanding, all questions were answered and he was stable at discharge.     FINAL CLINICAL IMPRESSION(S) / ED DIAGNOSES   Final diagnoses:  Nausea vomiting and diarrhea     Rx / DC Orders   ED Discharge Orders           Ordered    azithromycin (ZITHROMAX) 250 MG tablet        06/13/23 1059    promethazine (PHENERGAN) 12.5 MG tablet  Every 6 hours PRN        06/13/23 1059             Note:  This document was prepared using Dragon voice recognition software and may include unintentional dictation errors.   Cameron Ali, PA-C 06/13/23 1102    Merwyn Katos, MD 06/13/23 1538

## 2023-07-04 ENCOUNTER — Ambulatory Visit: Payer: 59 | Admitting: Dietician

## 2023-07-25 ENCOUNTER — Other Ambulatory Visit: Payer: Self-pay

## 2023-07-25 ENCOUNTER — Emergency Department
Admission: EM | Admit: 2023-07-25 | Discharge: 2023-07-25 | Disposition: A | Discharge status: Home / Self Care | Attending: Emergency Medicine | Admitting: Emergency Medicine

## 2023-07-25 DIAGNOSIS — R197 Diarrhea, unspecified: Secondary | ICD-10-CM | POA: Diagnosis not present

## 2023-07-25 DIAGNOSIS — R112 Nausea with vomiting, unspecified: Secondary | ICD-10-CM | POA: Insufficient documentation

## 2023-07-25 DIAGNOSIS — R1084 Generalized abdominal pain: Secondary | ICD-10-CM | POA: Diagnosis not present

## 2023-07-25 LAB — CBC WITH DIFFERENTIAL/PLATELET
Abs Immature Granulocytes: 0.01 10*3/uL (ref 0.00–0.07)
Basophils Absolute: 0 10*3/uL (ref 0.0–0.1)
Basophils Relative: 0 %
Eosinophils Absolute: 0.4 10*3/uL (ref 0.0–0.5)
Eosinophils Relative: 6 %
HCT: 44.4 % (ref 39.0–52.0)
Hemoglobin: 14.8 g/dL (ref 13.0–17.0)
Immature Granulocytes: 0 %
Lymphocytes Relative: 32 %
Lymphs Abs: 2.3 10*3/uL (ref 0.7–4.0)
MCH: 30.2 pg (ref 26.0–34.0)
MCHC: 33.3 g/dL (ref 30.0–36.0)
MCV: 90.6 fL (ref 80.0–100.0)
Monocytes Absolute: 0.4 10*3/uL (ref 0.1–1.0)
Monocytes Relative: 6 %
Neutro Abs: 4 10*3/uL (ref 1.7–7.7)
Neutrophils Relative %: 56 %
Platelets: 270 10*3/uL (ref 150–400)
RBC: 4.9 MIL/uL (ref 4.22–5.81)
RDW: 13.2 % (ref 11.5–15.5)
WBC: 7.2 10*3/uL (ref 4.0–10.5)
nRBC: 0 % (ref 0.0–0.2)

## 2023-07-25 LAB — LIPASE, BLOOD: Lipase: 28 U/L (ref 11–51)

## 2023-07-25 LAB — COMPREHENSIVE METABOLIC PANEL WITH GFR
ALT: 25 U/L (ref 0–44)
AST: 18 U/L (ref 15–41)
Albumin: 4.3 g/dL (ref 3.5–5.0)
Alkaline Phosphatase: 52 U/L (ref 38–126)
Anion gap: 8 (ref 5–15)
BUN: 13 mg/dL (ref 6–20)
CO2: 25 mmol/L (ref 22–32)
Calcium: 9.5 mg/dL (ref 8.9–10.3)
Chloride: 106 mmol/L (ref 98–111)
Creatinine, Ser: 1.07 mg/dL (ref 0.61–1.24)
GFR, Estimated: >60 mL/min (ref >60)
Glucose, Bld: 86 mg/dL (ref 70–99)
Potassium: 3.9 mmol/L (ref 3.5–5.1)
Sodium: 139 mmol/L (ref 135–145)
Total Bilirubin: 0.9 mg/dL (ref 0.0–1.2)
Total Protein: 7.5 g/dL (ref 6.5–8.1)

## 2023-07-25 MED ORDER — ONDANSETRON 4 MG PO TBDP
4.0000 mg | ORAL_TABLET | Freq: Once | ORAL | Status: AC
  Administered 2023-07-25: 4 mg via ORAL
  Filled 2023-07-25: qty 1

## 2023-07-25 MED ORDER — SODIUM CHLORIDE 0.9 % IV BOLUS
1000.0000 mL | Freq: Once | INTRAVENOUS | Status: AC
  Administered 2023-07-25: 1000 mL via INTRAVENOUS

## 2023-07-25 MED ORDER — ONDANSETRON HCL 4 MG/2ML IJ SOLN
4.0000 mg | Freq: Once | INTRAMUSCULAR | Status: DC
  Filled 2023-07-25: qty 2

## 2023-07-25 MED ORDER — SODIUM CHLORIDE 0.9 % IV BOLUS: 1000.0000 mL | Freq: Once | INTRAVENOUS | Status: DC

## 2023-07-25 MED ORDER — SODIUM CHLORIDE 0.9 % IV SOLN
12.5000 mg | Freq: Once | INTRAVENOUS | Status: AC
  Administered 2023-07-25: 12.5 mg via INTRAVENOUS
  Filled 2023-07-25: qty 12.5

## 2023-07-25 MED ORDER — PROMETHAZINE HCL 12.5 MG PO TABS: 12.5000 mg | ORAL_TABLET | Freq: Four times a day (QID) | ORAL | 0 refills | Status: AC | PRN

## 2023-07-25 NOTE — ED Triage Notes (Signed)
 Pt comes with possible dehydration. Pt states this all started last night. Pt states he can't keep anything down. Pt states it is messing with his weight loss medicine.  Pt states he is taking wegovy.

## 2023-07-25 NOTE — ED Provider Notes (Signed)
 [facility] Provider Note    Event Date/Time   First MD Initiated Contact with Patient 07/25/23 [phone]     (approximate)   History   Chief Complaint Abdominal Pain and Emesis   HPI  Craig Turner is a 23 y.o. male with past medical history of ADHD who presents to the ED complaining of abdominal pain.  Patient reports that he has had about 24 hours of diffuse crampy abdominal pain with nausea, vomiting, and diarrhea.  He states he is concerned he may be dehydrated as he has been unable to keep down liquids so far today.  He denies any blood in his emesis or stool, has not had any fevers, and denies any difficulty urinating.  He does state that he takes [place] for weight loss, is concerned this could be related.     Physical Exam   Triage Vital Signs: ED Triage Vitals  Encounter Vitals Group     BP 07/25/23 1749 123/71     Systolic BP Percentile --      Diastolic BP Percentile --      Pulse Rate 07/25/23 1749 100     Resp 07/25/23 1749 18     Temp 07/25/23 1749 98.7 F (37.1 C)     Temp src --      SpO2 07/25/23 1749 96 %     Weight --      Height --      Head Circumference --      Peak Flow --      Pain Score 07/25/23 1748 2     Pain Loc --      Pain Education --      Exclude from Growth Chart --     Most recent vital signs: Vitals:   07/25/23 1749  BP: 123/71  Pulse: 100  Resp: 18  Temp: 98.7 F (37.1 C)  SpO2: 96%    Constitutional: Alert and oriented. Eyes: Conjunctivae are normal. Head: Atraumatic. Nose: No congestion/rhinnorhea. Mouth/Throat: Mucous membranes are moist.  Cardiovascular: Normal rate, regular rhythm. Grossly normal heart sounds.  2+ radial pulses bilaterally. Respiratory: Normal respiratory effort.  No retractions. Lungs CTAB. Gastrointestinal: Soft and nontender. No distention. Musculoskeletal: No lower extremity tenderness nor edema.  Neurologic:  Normal speech and language. No gross focal neurologic  deficits are appreciated.    ED Results / Procedures / Treatments   Labs (all labs ordered are listed, but only abnormal results are displayed) Labs Reviewed  COMPREHENSIVE METABOLIC PANEL WITH GFR  CBC WITH DIFFERENTIAL/PLATELET  LIPASE, BLOOD  URINALYSIS, ROUTINE W REFLEX MICROSCOPIC    PROCEDURES:  Critical Care performed: No  Procedures   MEDICATIONS ORDERED IN ED: Medications  ondansetron (ZOFRAN-ODT) disintegrating tablet 4 mg (4 mg Oral Given 07/25/23 1943)  sodium chloride 0.9 % bolus 1,000 mL (1,000 mLs Intravenous New Bag/Given 07/25/23 2028)  promethazine (PHENERGAN) 12.5 mg in sodium chloride 0.9 % 50 mL IVPB (12.5 mg Intravenous New Bag/Given 07/25/23 2108)     IMPRESSION / MDM / ASSESSMENT AND PLAN / ED COURSE  I reviewed the triage vital signs and the nursing notes.                              23 y.o. male with past medical history of ADHD who presents to the ED complaining of diffuse crampy abdominal pain with nausea, vomiting, and diarrhea since last night.  Patient's presentation is most consistent with  acute presentation with potential threat to life or bodily function.  Differential diagnosis includes, but is not limited to, gastroenteritis, dehydration, electrolyte abnormality, AKI, appendicitis, diverticulitis, gastritis.  Patient nontoxic-appearing and in no acute distress, vital signs are unremarkable.  Patient has a benign abdominal exam and labs are reassuring with no significant anemia, leukocytosis, electrolyte abnormality, or AKI.  LFTs and lipase are also unremarkable, suspect gastroenteritis.  He was given ODT Zofran without improvement in symptoms, will give IV fluid bolus and treat with IV Phenergan.  He currently declines pain medication.  Patient feeling better following IV Phenergan, tolerating oral intake without difficulty.  He is appropriate for discharge home with outpatient follow-up, was counseled to return to the ED for new or  worsening symptoms.  Patient agrees with plan.      FINAL CLINICAL IMPRESSION(S) / ED DIAGNOSES   Final diagnoses:  Nausea vomiting and diarrhea     Rx / DC Orders   ED Discharge Orders          Ordered    promethazine (PHENERGAN) 12.5 MG tablet  Every 6 hours PRN        07/25/23 2153             Note:  This document was prepared using Dragon voice recognition software and may include unintentional dictation errors.   Twilla Galea, MD 07/25/23 2154

## 2023-07-25 NOTE — ED Provider Triage Note (Signed)
 Emergency Medicine Provider Triage Evaluation Note  Craig Turner , a 23 y.o. male  was evaluated in triage.  Pt complains of vomiting and thinks he may be dehydrated.  Review of Systems  Positive: vomiting Negative:   Physical Exam  There were no vitals taken for this visit. Gen:   Awake, no distress   Resp:  Normal effort  MSK:   Moves extremities without difficulty  Other:    Medical Decision Making  Medically screening exam initiated at 5:48 PM.  Appropriate orders placed.  Craig Turner was informed that the remainder of the evaluation will be completed by another provider, this initial triage assessment does not replace that evaluation, and the importance of remaining in the ED until their evaluation is complete.     Craig Breen, PA-C 07/25/23 1752

## 2023-09-05 ENCOUNTER — Other Ambulatory Visit: Payer: Self-pay

## 2023-09-05 ENCOUNTER — Emergency Department
Admission: EM | Admit: 2023-09-05 | Discharge: 2023-09-05 | Disposition: A | Discharge status: Home / Self Care | Attending: Emergency Medicine | Admitting: Emergency Medicine

## 2023-09-05 DIAGNOSIS — R319 Hematuria, unspecified: Secondary | ICD-10-CM | POA: Insufficient documentation

## 2023-09-05 DIAGNOSIS — Y92003 Bedroom of unspecified non-institutional (private) residence as the place of occurrence of the external cause: Secondary | ICD-10-CM | POA: Insufficient documentation

## 2023-09-05 DIAGNOSIS — W06XXXA Fall from bed, initial encounter: Secondary | ICD-10-CM | POA: Insufficient documentation

## 2023-09-05 LAB — URINALYSIS, ROUTINE W REFLEX MICROSCOPIC
Bacteria, UA: NONE SEEN
Bilirubin Urine: NEGATIVE
Glucose, UA: NEGATIVE mg/dL
Ketones, ur: NEGATIVE mg/dL
Leukocytes,Ua: NEGATIVE
Nitrite: NEGATIVE
Protein, ur: NEGATIVE mg/dL
Specific Gravity, Urine: 1.019 (ref 1.005–1.030)
pH: 6 (ref 5.0–8.0)

## 2023-09-05 LAB — COMPREHENSIVE METABOLIC PANEL WITH GFR
ALT: 23 U/L (ref 0–44)
AST: 18 U/L (ref 15–41)
Albumin: 3.9 g/dL (ref 3.5–5.0)
Alkaline Phosphatase: 52 U/L (ref 38–126)
Anion gap: 9 (ref 5–15)
BUN: 11 mg/dL (ref 6–20)
CO2: 30 mmol/L (ref 22–32)
Calcium: 8.4 mg/dL — ABNORMAL LOW (ref 8.9–10.3)
Chloride: 100 mmol/L (ref 98–111)
Creatinine, Ser: 1.05 mg/dL (ref 0.61–1.24)
GFR, Estimated: >60 mL/min (ref >60)
Glucose, Bld: 77 mg/dL (ref 70–99)
Potassium: 3.8 mmol/L (ref 3.5–5.1)
Sodium: 139 mmol/L (ref 135–145)
Total Bilirubin: 0.6 mg/dL (ref 0.0–1.2)
Total Protein: 6.7 g/dL (ref 6.5–8.1)

## 2023-09-05 LAB — CBC WITH DIFFERENTIAL/PLATELET
Abs Immature Granulocytes: 0.01 10*3/uL (ref 0.00–0.07)
Basophils Absolute: 0.1 10*3/uL (ref 0.0–0.1)
Basophils Relative: 1 %
Eosinophils Absolute: 1 10*3/uL — ABNORMAL HIGH (ref 0.0–0.5)
Eosinophils Relative: 13 %
HCT: 45.5 % (ref 39.0–52.0)
Hemoglobin: 15 g/dL (ref 13.0–17.0)
Immature Granulocytes: 0 %
Lymphocytes Relative: 41 %
Lymphs Abs: 3.3 10*3/uL (ref 0.7–4.0)
MCH: 29.4 pg (ref 26.0–34.0)
MCHC: 33 g/dL (ref 30.0–36.0)
MCV: 89 fL (ref 80.0–100.0)
Monocytes Absolute: 0.5 10*3/uL (ref 0.1–1.0)
Monocytes Relative: 6 %
Neutro Abs: 3.1 10*3/uL (ref 1.7–7.7)
Neutrophils Relative %: 39 %
Platelets: 279 10*3/uL (ref 150–400)
RBC: 5.11 MIL/uL (ref 4.22–5.81)
RDW: 13.2 % (ref 11.5–15.5)
WBC: 8 10*3/uL (ref 4.0–10.5)
nRBC: 0 % (ref 0.0–0.2)

## 2023-09-05 LAB — CK: Total CK: 200 U/L (ref 49–397)

## 2023-09-05 NOTE — ED Provider Notes (Signed)
 Shared visit   Patient endorses hematuria after falling out of bed.  UA with signs of blood.  Have low suspicion for rhabdomyolysis.  Will add on lab work.  No findings of urinary tract infection.   Craig Ground, MD 09/05/23 [phone]

## 2023-09-05 NOTE — ED Notes (Signed)
 Urine appears clear, yellow.

## 2023-09-05 NOTE — ED Notes (Signed)
 See triage notes. Patient c/o urinating blood secondary to falling out of the bed around 0300 this morning. Patient stated it burns/aches a little after he finished urinating and that his urine stinks.

## 2023-09-05 NOTE — ED Provider Notes (Signed)
 [facility] Provider Note    Event Date/Time   First MD Initiated Contact with Patient 09/05/23 1337     (approximate)   History   Fall   HPI  Craig Turner is a 23 y.o. male with history of ADHD and as listed in EMR presents to the emergency department for treatment and evaluation of urinating blood.  Patient states that he rolled off the bed last night and fell back to sleep.  Upon awakening this morning he noted blood in the urine stream.  He also noted some blood in his shorts.  He states that when he landed, he landed flat on his abdomen.  He denies other injury.  He is not on any blood thinners and did not experience loss of consciousness.  He denies any external signs of injury and has not noticed as much blood in his urine as the day has progressed.  He denies abdominal pain, nausea, vomiting, fever, or concern for STI.      Physical Exam   Triage Vital Signs: ED Triage Vitals [09/05/23 1254]  Encounter Vitals Group     BP 134/87     Systolic BP Percentile      Diastolic BP Percentile      Pulse Rate 74     Resp 17     Temp 98.4 F (36.9 C)     Temp Source Oral     SpO2 (!) 74 %     Weight      Height      Head Circumference      Peak Flow      Pain Score 6     Pain Loc      Pain Education      Exclude from Growth Chart     Most recent vital signs: Vitals:   09/05/23 1254 09/05/23 1344  BP: 134/87   Pulse: 74   Resp: 17   Temp: 98.4 F (36.9 C)   SpO2: (!) 74% 98%    General: Awake, no distress.  CV:  Good peripheral perfusion.  Resp:  Normal effort.  Abd:  No distention.  Other:  External genital exam unremarkable.  No blood noted.   ED Results / Procedures / Treatments   Labs (all labs ordered are listed, but only abnormal results are displayed) Labs Reviewed  URINALYSIS, ROUTINE W REFLEX MICROSCOPIC - Abnormal; Notable for the following components:      Result Value   Color, Urine YELLOW (*)    APPearance  CLEAR (*)    Hgb urine dipstick SMALL (*)    All other components within normal limits  CBC WITH DIFFERENTIAL/PLATELET - Abnormal; Notable for the following components:   Eosinophils Absolute 1.0 (*)    All other components within normal limits  COMPREHENSIVE METABOLIC PANEL WITH GFR - Abnormal; Notable for the following components:   Calcium 8.4 (*)    All other components within normal limits  CK     EKG  Not indicated   RADIOLOGY  Image and radiology report reviewed and interpreted by me. Radiology report consistent with the same.  Not indicated  PROCEDURES:  Critical Care performed: No  Procedures   MEDICATIONS ORDERED IN ED:  Medications - No data to display   IMPRESSION / MDM / ASSESSMENT AND PLAN / ED COURSE   I have reviewed the triage note.  Differential diagnosis includes, but is not limited to, urethritis, hematuria, seizure, rhabdomyolysis  Patient's presentation is most consistent with  acute complicated illness / injury requiring diagnostic workup.  23 year old male presenting to the emergency department for evaluation of hematuria.  See HPI for further details.  Exam is reassuring.  Vital signs are normal.  CBC is normal, CMP is normal, CK is normal, and urinalysis shows a small amount of hemoglobin and 11-20 red blood cells but otherwise normal.  Workup is without concern.  Plan will be to have patient follow-up with primary care provider and a referral has been entered today.  The reasoning that he fell out of bed is unclear.  He denies history of seizures.  He was also given follow-up information for urology.  He was advised to return to the emergency department for symptoms that change or worsen if unable to see primary care or the specialist.      FINAL CLINICAL IMPRESSION(S) / ED DIAGNOSES   Final diagnoses:  Hematuria, unspecified type  Fall from bed, initial encounter     Rx / DC Orders   ED Discharge Orders          Ordered     Ambulatory Referral to Primary Care (Establish Care)        09/05/23 1635             Note:  This document was prepared using Dragon voice recognition software and may include unintentional dictation errors.   Sherryle Don, FNP 09/05/23 1727    Viviano Ground, MD 09/06/23 2332

## 2023-09-05 NOTE — Discharge Instructions (Signed)
 Please follow-up with primary care and urology.  Return to the emergency department for symptoms that change or worsen if you are unable to schedule appointment.

## 2023-09-05 NOTE — ED Triage Notes (Addendum)
 Pt rolled off bed last night and went back to sleep, states he's urinating blood today. Pt is AOX4, NAD noted. Pt c/o genital pain, denies swelling, no thinners, no LOC.
